# Patient Record
Sex: Female | Born: 1955 | Race: White | Hispanic: No | Smoking: Never smoker
Health system: Southern US, Community
[De-identification: ages and names within clinical notes are randomized; demographics above are authoritative.]

---

## 2007-09-27 ENCOUNTER — Ambulatory Visit (HOSPITAL_COMMUNITY): Payer: Self-pay | Admitting: Psychiatry

## 2007-11-29 ENCOUNTER — Ambulatory Visit (HOSPITAL_COMMUNITY): Payer: Self-pay | Admitting: Psychiatry

## 2007-12-30 ENCOUNTER — Ambulatory Visit (HOSPITAL_COMMUNITY): Payer: Self-pay | Admitting: Psychiatry

## 2008-01-24 ENCOUNTER — Ambulatory Visit (HOSPITAL_COMMUNITY): Payer: Self-pay | Admitting: Psychiatry

## 2008-05-23 ENCOUNTER — Ambulatory Visit (HOSPITAL_COMMUNITY): Payer: Self-pay | Admitting: Psychiatry

## 2008-07-12 ENCOUNTER — Ambulatory Visit (HOSPITAL_COMMUNITY): Payer: Self-pay | Admitting: Psychiatry

## 2008-10-09 ENCOUNTER — Ambulatory Visit (HOSPITAL_COMMUNITY): Payer: Self-pay | Admitting: Psychiatry

## 2009-01-11 ENCOUNTER — Ambulatory Visit (HOSPITAL_COMMUNITY): Payer: Self-pay | Admitting: Psychiatry

## 2009-04-09 ENCOUNTER — Ambulatory Visit (HOSPITAL_COMMUNITY): Payer: Self-pay | Admitting: Psychiatry

## 2009-07-09 ENCOUNTER — Ambulatory Visit (HOSPITAL_COMMUNITY): Payer: Self-pay | Admitting: Psychiatry

## 2009-09-14 ENCOUNTER — Ambulatory Visit (HOSPITAL_COMMUNITY): Admission: RE | Admit: 2009-09-14 | Discharge: 2009-09-14 | Payer: Self-pay | Admitting: Orthopaedic Surgery

## 2009-10-04 ENCOUNTER — Ambulatory Visit (HOSPITAL_COMMUNITY): Payer: Self-pay | Admitting: Psychiatry

## 2010-01-17 ENCOUNTER — Ambulatory Visit (HOSPITAL_COMMUNITY): Payer: Self-pay | Admitting: Psychiatry

## 2010-09-16 LAB — BASIC METABOLIC PANEL
CO2: 30 mEq/L (ref 19–32)
Chloride: 101 mEq/L (ref 96–112)
Potassium: 4.5 mEq/L (ref 3.5–5.1)

## 2010-09-16 LAB — CBC
HCT: 41.4 % (ref 36.0–46.0)
Hemoglobin: 14 g/dL (ref 12.0–15.0)
MCHC: 33.8 g/dL (ref 30.0–36.0)
MCV: 91.6 fL (ref 78.0–100.0)
Platelets: 277 10*3/uL (ref 150–400)
RDW: 14 % (ref 11.5–15.5)

## 2010-09-16 LAB — GLUCOSE, CAPILLARY
Glucose-Capillary: 237 mg/dL — ABNORMAL HIGH (ref 70–99)
Glucose-Capillary: 272 mg/dL — ABNORMAL HIGH (ref 70–99)

## 2011-03-26 DIAGNOSIS — M791 Myalgia, unspecified site: Secondary | ICD-10-CM | POA: Insufficient documentation

## 2011-05-06 IMAGING — CR DG CHEST 2V
2 series · 2 of 2 positions shown · non-contrast
Comparison: None.

CLINICAL DATA: Preop for left knee arthroscopy

CHEST - 2 VIEW

[view not recorded (1 of 2)]
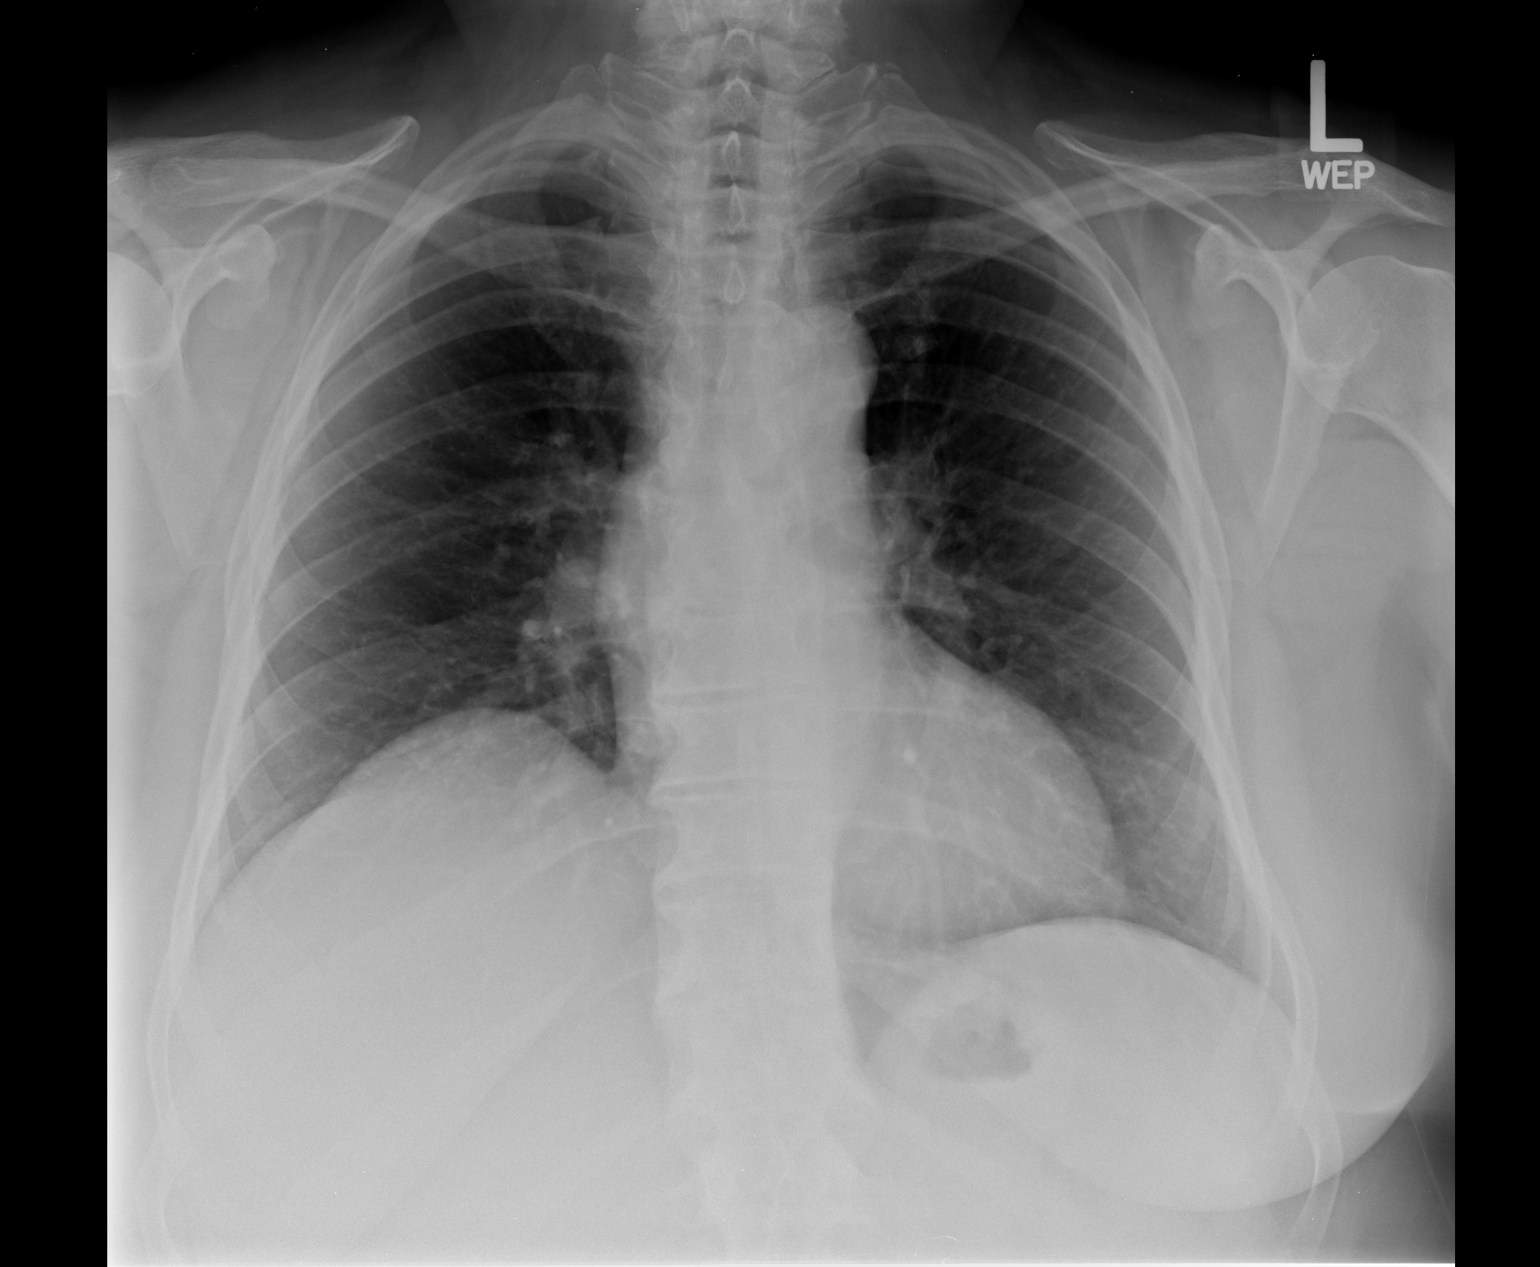

[view not recorded (2 of 2)]
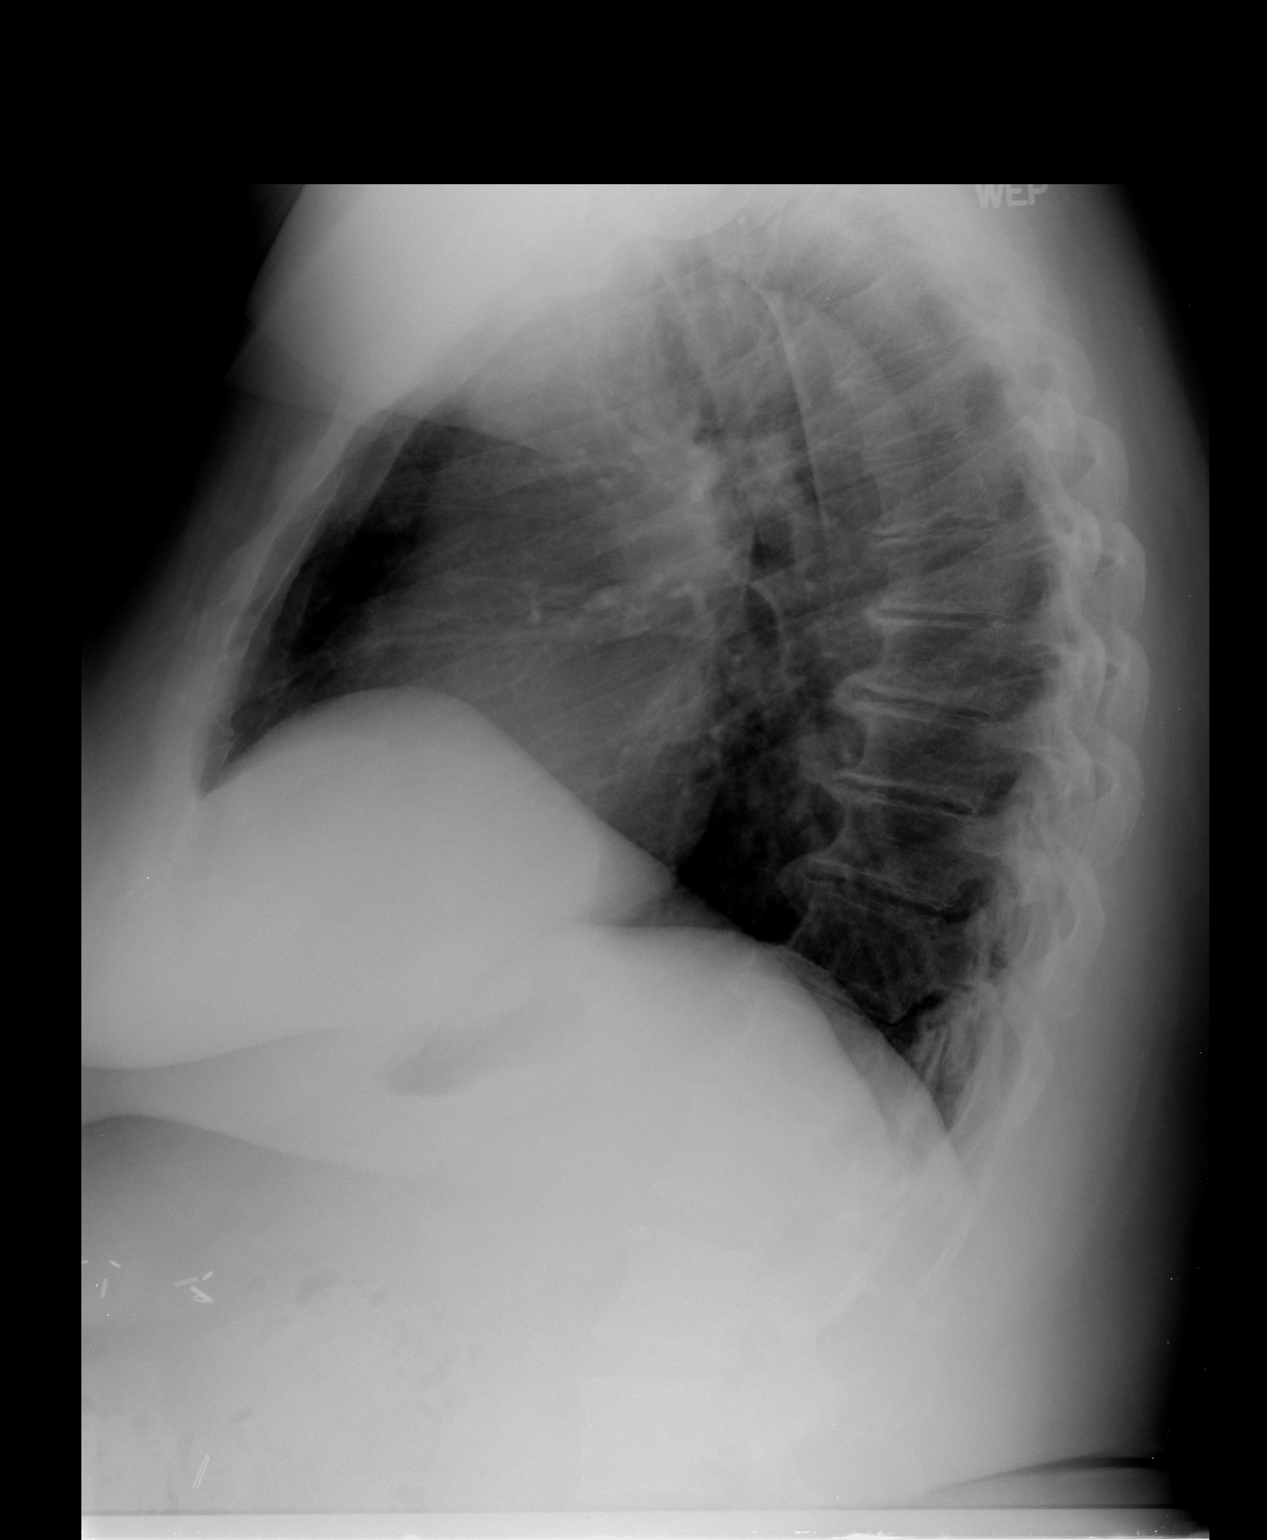

[2 of 2 positions shown; findings below may reference images not displayed]

FINDINGS: Cardiomediastinal silhouette is unremarkable.  No acute
infiltrate or pleural effusion.  No pulmonary edema.  Degenerative
changes thoracic spine. There is elevation of the right
hemidiaphragm.
IMPRESSION: No active disease.  Degenerative changes thoracic spine.

## 2011-07-02 DIAGNOSIS — E559 Vitamin D deficiency, unspecified: Secondary | ICD-10-CM | POA: Insufficient documentation

## 2011-08-05 DIAGNOSIS — F331 Major depressive disorder, recurrent, moderate: Secondary | ICD-10-CM | POA: Insufficient documentation

## 2013-02-10 DIAGNOSIS — E039 Hypothyroidism, unspecified: Secondary | ICD-10-CM | POA: Insufficient documentation

## 2013-04-13 DIAGNOSIS — E1159 Type 2 diabetes mellitus with other circulatory complications: Secondary | ICD-10-CM | POA: Insufficient documentation

## 2013-05-02 DIAGNOSIS — G4700 Insomnia, unspecified: Secondary | ICD-10-CM | POA: Insufficient documentation

## 2013-05-03 DIAGNOSIS — Z8673 Personal history of transient ischemic attack (TIA), and cerebral infarction without residual deficits: Secondary | ICD-10-CM | POA: Insufficient documentation

## 2014-12-05 DIAGNOSIS — G629 Polyneuropathy, unspecified: Secondary | ICD-10-CM | POA: Insufficient documentation

## 2016-04-18 DIAGNOSIS — M545 Low back pain: Secondary | ICD-10-CM

## 2016-04-18 DIAGNOSIS — G8929 Other chronic pain: Secondary | ICD-10-CM | POA: Insufficient documentation

## 2016-08-06 DIAGNOSIS — K760 Fatty (change of) liver, not elsewhere classified: Secondary | ICD-10-CM | POA: Insufficient documentation

## 2016-08-06 DIAGNOSIS — IMO0001 Reserved for inherently not codable concepts without codable children: Secondary | ICD-10-CM | POA: Insufficient documentation

## 2017-10-05 DIAGNOSIS — M2170 Unequal limb length (acquired), unspecified site: Secondary | ICD-10-CM | POA: Insufficient documentation

## 2017-10-05 DIAGNOSIS — M48061 Spinal stenosis, lumbar region without neurogenic claudication: Secondary | ICD-10-CM | POA: Insufficient documentation

## 2018-03-04 ENCOUNTER — Ambulatory Visit (HOSPITAL_COMMUNITY): Payer: Self-pay | Admitting: Psychiatry

## 2018-04-06 ENCOUNTER — Ambulatory Visit (HOSPITAL_COMMUNITY): Payer: Self-pay | Admitting: Psychiatry

## 2018-05-06 ENCOUNTER — Ambulatory Visit (HOSPITAL_COMMUNITY): Payer: Self-pay | Admitting: Psychiatry

## 2018-05-10 DIAGNOSIS — M4310 Spondylolisthesis, site unspecified: Secondary | ICD-10-CM | POA: Insufficient documentation

## 2018-05-11 ENCOUNTER — Encounter (HOSPITAL_COMMUNITY): Payer: Self-pay | Admitting: Psychiatry

## 2018-05-11 ENCOUNTER — Ambulatory Visit (INDEPENDENT_AMBULATORY_CARE_PROVIDER_SITE_OTHER): Payer: Medicare Other | Admitting: Psychiatry

## 2018-05-11 VITALS — BP 130/88 | HR 86 | Ht 66.0 in | Wt 240.0 lb

## 2018-05-11 DIAGNOSIS — M791 Myalgia, unspecified site: Secondary | ICD-10-CM | POA: Diagnosis not present

## 2018-05-11 DIAGNOSIS — F5102 Adjustment insomnia: Secondary | ICD-10-CM

## 2018-05-11 DIAGNOSIS — M545 Low back pain, unspecified: Secondary | ICD-10-CM

## 2018-05-11 DIAGNOSIS — F331 Major depressive disorder, recurrent, moderate: Secondary | ICD-10-CM | POA: Diagnosis not present

## 2018-05-11 DIAGNOSIS — F411 Generalized anxiety disorder: Secondary | ICD-10-CM | POA: Diagnosis not present

## 2018-05-11 DIAGNOSIS — G894 Chronic pain syndrome: Secondary | ICD-10-CM | POA: Insufficient documentation

## 2018-05-11 DIAGNOSIS — M47816 Spondylosis without myelopathy or radiculopathy, lumbar region: Secondary | ICD-10-CM | POA: Insufficient documentation

## 2018-05-11 DIAGNOSIS — G8929 Other chronic pain: Secondary | ICD-10-CM

## 2018-05-11 DIAGNOSIS — M5136 Other intervertebral disc degeneration, lumbar region: Secondary | ICD-10-CM | POA: Insufficient documentation

## 2018-05-11 MED ORDER — BUSPIRONE HCL 7.5 MG PO TABS: 7.5000 mg | ORAL_TABLET | Freq: Every day | ORAL | 1 refills | Status: DC

## 2018-05-11 MED ORDER — DULOXETINE HCL 30 MG PO CPEP: 30.0000 mg | ORAL_CAPSULE | Freq: Two times a day (BID) | ORAL | 1 refills | Status: DC

## 2018-05-11 MED ORDER — LAMOTRIGINE 100 MG PO TABS: ORAL_TABLET | ORAL | 1 refills | Status: DC
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
## 2018-05-31 ENCOUNTER — Ambulatory Visit (INDEPENDENT_AMBULATORY_CARE_PROVIDER_SITE_OTHER): Payer: Medicare Other | Admitting: Licensed Clinical Social Worker

## 2018-05-31 DIAGNOSIS — M791 Myalgia, unspecified site: Secondary | ICD-10-CM

## 2018-05-31 DIAGNOSIS — F411 Generalized anxiety disorder: Secondary | ICD-10-CM

## 2018-05-31 DIAGNOSIS — F5102 Adjustment insomnia: Secondary | ICD-10-CM

## 2018-05-31 DIAGNOSIS — F331 Major depressive disorder, recurrent, moderate: Secondary | ICD-10-CM

## 2018-05-31 DIAGNOSIS — M545 Low back pain: Secondary | ICD-10-CM

## 2018-05-31 DIAGNOSIS — G8929 Other chronic pain: Secondary | ICD-10-CM
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
## 2018-06-03 ENCOUNTER — Other Ambulatory Visit (HOSPITAL_COMMUNITY): Payer: Self-pay | Admitting: Psychiatry

## 2018-06-08 ENCOUNTER — Ambulatory Visit (INDEPENDENT_AMBULATORY_CARE_PROVIDER_SITE_OTHER): Payer: Medicare Other | Admitting: Psychiatry

## 2018-06-08 ENCOUNTER — Encounter (HOSPITAL_COMMUNITY): Payer: Self-pay | Admitting: Psychiatry

## 2018-06-08 ENCOUNTER — Other Ambulatory Visit: Payer: Self-pay

## 2018-06-08 VITALS — BP 130/88 | HR 92 | Ht 66.0 in | Wt 242.0 lb

## 2018-06-08 DIAGNOSIS — M791 Myalgia, unspecified site: Secondary | ICD-10-CM | POA: Diagnosis not present

## 2018-06-08 DIAGNOSIS — F5102 Adjustment insomnia: Secondary | ICD-10-CM | POA: Diagnosis not present

## 2018-06-08 DIAGNOSIS — F331 Major depressive disorder, recurrent, moderate: Secondary | ICD-10-CM | POA: Diagnosis not present

## 2018-06-08 DIAGNOSIS — F411 Generalized anxiety disorder: Secondary | ICD-10-CM | POA: Diagnosis not present

## 2018-06-08 MED ORDER — LAMOTRIGINE 100 MG PO TABS: ORAL_TABLET | ORAL | 1 refills | Status: DC

## 2018-06-08 MED ORDER — DULOXETINE HCL 30 MG PO CPEP: 30.0000 mg | ORAL_CAPSULE | Freq: Two times a day (BID) | ORAL | 1 refills | Status: DC

## 2018-06-08 MED ORDER — BUSPIRONE HCL 7.5 MG PO TABS: 7.5000 mg | ORAL_TABLET | Freq: Two times a day (BID) | ORAL | 1 refills | Status: DC
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
## 2018-06-22 ENCOUNTER — Ambulatory Visit (HOSPITAL_COMMUNITY): Payer: Medicare Other | Admitting: Licensed Clinical Social Worker

## 2018-07-02 ENCOUNTER — Other Ambulatory Visit (HOSPITAL_COMMUNITY): Payer: Self-pay | Admitting: Psychiatry

## 2018-07-05 ENCOUNTER — Ambulatory Visit (HOSPITAL_COMMUNITY): Payer: Medicare Other | Admitting: Licensed Clinical Social Worker

## 2018-07-13 ENCOUNTER — Ambulatory Visit (INDEPENDENT_AMBULATORY_CARE_PROVIDER_SITE_OTHER): Payer: Medicare Other | Admitting: Licensed Clinical Social Worker

## 2018-07-13 DIAGNOSIS — M545 Low back pain: Secondary | ICD-10-CM

## 2018-07-13 DIAGNOSIS — F411 Generalized anxiety disorder: Secondary | ICD-10-CM

## 2018-07-13 DIAGNOSIS — F5102 Adjustment insomnia: Secondary | ICD-10-CM

## 2018-07-13 DIAGNOSIS — M791 Myalgia, unspecified site: Secondary | ICD-10-CM | POA: Diagnosis not present

## 2018-07-13 DIAGNOSIS — G8929 Other chronic pain: Secondary | ICD-10-CM

## 2018-07-13 DIAGNOSIS — F331 Major depressive disorder, recurrent, moderate: Secondary | ICD-10-CM | POA: Diagnosis not present
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
## 2018-08-10 ENCOUNTER — Ambulatory Visit (HOSPITAL_COMMUNITY): Payer: Medicare Other | Admitting: Licensed Clinical Social Worker

## 2018-08-17 ENCOUNTER — Ambulatory Visit (HOSPITAL_COMMUNITY): Payer: Medicare Other | Admitting: Licensed Clinical Social Worker

## 2018-08-31 ENCOUNTER — Other Ambulatory Visit (HOSPITAL_COMMUNITY): Payer: Self-pay | Admitting: Psychiatry

## 2018-09-02 ENCOUNTER — Ambulatory Visit (HOSPITAL_COMMUNITY): Payer: Medicare Other | Admitting: Psychiatry

## 2018-09-08 ENCOUNTER — Ambulatory Visit (HOSPITAL_COMMUNITY): Payer: Medicare Other | Admitting: Licensed Clinical Social Worker

## 2018-09-14 ENCOUNTER — Ambulatory Visit (HOSPITAL_COMMUNITY): Payer: Medicare Other | Admitting: Licensed Clinical Social Worker

## 2018-09-20 ENCOUNTER — Ambulatory Visit (INDEPENDENT_AMBULATORY_CARE_PROVIDER_SITE_OTHER): Payer: Medicare Other | Admitting: Psychiatry

## 2018-09-20 ENCOUNTER — Other Ambulatory Visit: Payer: Self-pay

## 2018-09-20 ENCOUNTER — Encounter (HOSPITAL_COMMUNITY): Payer: Self-pay | Admitting: Psychiatry

## 2018-09-20 DIAGNOSIS — F5102 Adjustment insomnia: Secondary | ICD-10-CM

## 2018-09-20 DIAGNOSIS — F331 Major depressive disorder, recurrent, moderate: Secondary | ICD-10-CM

## 2018-09-20 DIAGNOSIS — F411 Generalized anxiety disorder: Secondary | ICD-10-CM | POA: Diagnosis not present

## 2018-09-20 MED ORDER — DULOXETINE HCL 30 MG PO CPEP: 30.0000 mg | ORAL_CAPSULE | Freq: Two times a day (BID) | ORAL | 3 refills | Status: DC

## 2018-09-20 MED ORDER — LAMOTRIGINE 100 MG PO TABS: ORAL_TABLET | ORAL | 2 refills | Status: DC
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
## 2018-09-30 ENCOUNTER — Ambulatory Visit (INDEPENDENT_AMBULATORY_CARE_PROVIDER_SITE_OTHER): Payer: Medicare Other | Admitting: Licensed Clinical Social Worker

## 2018-09-30 DIAGNOSIS — F5102 Adjustment insomnia: Secondary | ICD-10-CM

## 2018-09-30 DIAGNOSIS — F411 Generalized anxiety disorder: Secondary | ICD-10-CM

## 2018-09-30 DIAGNOSIS — F331 Major depressive disorder, recurrent, moderate: Secondary | ICD-10-CM | POA: Diagnosis not present
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
## 2018-10-13 ENCOUNTER — Ambulatory Visit (INDEPENDENT_AMBULATORY_CARE_PROVIDER_SITE_OTHER): Payer: Medicare Other | Admitting: Licensed Clinical Social Worker

## 2018-10-13 DIAGNOSIS — F5102 Adjustment insomnia: Secondary | ICD-10-CM

## 2018-10-13 DIAGNOSIS — F411 Generalized anxiety disorder: Secondary | ICD-10-CM | POA: Diagnosis not present

## 2018-10-13 DIAGNOSIS — F331 Major depressive disorder, recurrent, moderate: Secondary | ICD-10-CM | POA: Diagnosis not present

## 2018-10-13 NOTE — Progress Notes (Signed)
Virtual Visit via Telephone Note  I connected with [name] on 10/13/18 at  1:00 PM EDT by telephone and verified that I am speaking with the correct person using two identifiers.   I discussed the limitations, risks, security and privacy concerns of performing an evaluation and management service by telephone and the availability of in person appointments. I also discussed with the patient that there may be a patient responsible charge related to this service. The patient expressed understanding and agreed to proceed.  Follow Up Instructions:    I discussed the assessment and treatment plan with the patient. The patient was provided an opportunity to ask questions and all were answered. The patient agreed with the plan and demonstrated an understanding of the instructions.   The patient was advised to call back or seek an in-person evaluation if the symptoms worsen or if the condition fails to improve as anticipated.  I provided 52 minutes of non-face-to-face time during this encounter.   [name], LCSW   THERAPIST PROGRESS NOTE  Session Time: 1:00 PM to 1:52 PM  Participation Level: Active  Behavioral Response: CasualAlertappropriate  Type of Therapy: Individual Therapy  Treatment Goals addressed:   decrease in depression and anxiety (patient recognizes the need to be more active in improving symptoms  Interventions: Solution Focused, Strength-based, Supportive and Other: coping  Summary: Isabel Ellis is a 63 y.o. female who presents with really sick with sinus infection for past two weeks, headache for three days-cried constantly, tried everything and called doctor and said that she can't take it anymore. (Dr. 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 Finally went to sleep after taking migraine medicine, woke up and headache was tolerable and then finally went away.  Had dinner with  daughter(Isabel Ellis) and and son-in-law first time talked to each other in over a year. Was so good to talk to her, heart was filled up with joy. Now closer with her that "things may turn up". Had been estranged.   Worried about current situation and where she is going to live. Mortgage company working with her. [facility] sent a court order for back taxes which she thought ex-husband had been paying.  Filled out form to submit along with divorce and separation agreement that shows ex-husband responsible to pay mortgage and [name], taxes. She doesn't have his address. Plans to call circuit court to see if they can help locate him. Kids will help with helping her find a place providing finances. Would like to find a way to keep the house if she can. Expenses include has to do repairs on house.  Used to ladder where she is on steps that goes up (can last a couple of years) and then goes down and doesn't know how to break cycle. Things were good with husband, married for 20 years and then he left her. Afraid to go up ladder because tired of the cycle, this is the way she was in  2019. She would like to keep climbing the ladder, but always get knocked down.  Shared positive news that wedding going forward and that is something that makes her happy.  Sister in [place], executor of dad's estate, they have five properties, she may ask sister for back taxes if she has to and take out of property when it sell.   Suicidal/Homicidal: No  Therapist Response: Therapist assessed patient current functioning and processed feelings related to current  stressors.  Engaged in problem-solving to help patient in addressing stressors.  Suggested for example, calling senior center who may have available resources to direct patient to Pointed out positive developments helping with mood including talking to daughter after being more distant, son's wedding going forward.  Helpful for mood to have things to look forward to.  Discussed kids  helping is giving her some security, and in general having supports helps with coping, helps with managing stressors. Reviewed treatment plan and patient gave verbal assent to completing plan.  While doing treatment plan getting more aware of supports and pointed this out to patient as helpful to have someone to turn to if she needs to, people she is known for long time.   Provided positive feedback for patient being proactive in steps to work on stressors and explained this will help her move forward with goals.   Reviewed patient's description of feeling dealing with setbacks, and discussed per minute positive qualities and when life that one becomes aware of that is there no matter what happened such as her family, positive activities of her life that she enjoys and importantly having herself.  That these permanent things can provide contentment. The same time role of therapy is help her move forward with goals, currently assist patient in problem-solving related to stressors that help her move forward with her goals.  Provided strength based and supportive intervention.  Plan: Return again in 2 weeks.2.  Therapist continue to work with patient on stress management, supportive interventions and mood regulation  Diagnosis: Axis I:  major depressive disorder, recurrent, moderate, generalized anxiety disorder, adjustment insomnia    Axis II: No diagnosis    Coolidge BreezeMary Bowman, LCSW 10/13/2018

## 2018-10-25 ENCOUNTER — Ambulatory Visit (INDEPENDENT_AMBULATORY_CARE_PROVIDER_SITE_OTHER): Payer: Medicare Other | Admitting: Licensed Clinical Social Worker

## 2018-10-25 ENCOUNTER — Other Ambulatory Visit: Payer: Self-pay

## 2018-10-25 DIAGNOSIS — F5102 Adjustment insomnia: Secondary | ICD-10-CM

## 2018-10-25 DIAGNOSIS — F411 Generalized anxiety disorder: Secondary | ICD-10-CM | POA: Diagnosis not present

## 2018-10-25 DIAGNOSIS — F331 Major depressive disorder, recurrent, moderate: Secondary | ICD-10-CM
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
## 2018-10-26 ENCOUNTER — Other Ambulatory Visit (HOSPITAL_COMMUNITY): Payer: Self-pay | Admitting: Psychiatry

## 2018-11-08 ENCOUNTER — Ambulatory Visit (INDEPENDENT_AMBULATORY_CARE_PROVIDER_SITE_OTHER): Payer: Medicare Other | Admitting: Licensed Clinical Social Worker

## 2018-11-08 DIAGNOSIS — F5102 Adjustment insomnia: Secondary | ICD-10-CM

## 2018-11-08 DIAGNOSIS — F331 Major depressive disorder, recurrent, moderate: Secondary | ICD-10-CM

## 2018-11-08 NOTE — Progress Notes (Signed)
  Virtual Visit via Telephone Note  I connected with Isabel Ellis on 11/08/18 at  9:00 AM EDT by telephone and verified that I am speaking with the correct person using two identifiers.   I discussed the limitations, risks, security and privacy concerns of performing an evaluation and management service by telephone and the availability of in person appointments. I also discussed with the patient that there may be a patient responsible charge related to this service. The patient expressed understanding and agreed to proceed.  THERAPIST PROGRESS NOTE  Session Time: 9:01 to 9:16 AM  Participation Level: Active  Behavioral Response: CasualAlertnot feeling well due to sinus problems  Type of Therapy: Individual Therapy  Treatment Goals addressed: decrease in depression and anxiety (patient recognizes the need to be more active in improving symptoms  Interventions: Supportive and Other: coping, strategies to help improve mood  Summary: Isabel Ellis is a 63 y.o. female who presents with not feeling well today, having problems with sinus, bad headache, ears hurt and doesn't feel like talking. May have to go in to doctor at this point. She has been on Augmentin, Prednisone for 7 days which she has to be careful on, Phenrgan, strong antibiotic that worked at first but then got worse tore up stomach, been on two antibiotics. Shared that son's wedding was beautiful. A moment that was special for her was being able to dance with her son. Ryder, her grandchild, who stays with her a couple days a week, came up to her, very attentive and told her to put her head on shoulder.  She was happy that with so few people it still turned out really well.  Suicidal/Homicidal: No  Therapist Response: Assess patient current functioning per report and patient not feeling well today, so session kept short.  Reviewed her physical symptoms and provide supportive interventions.  Reviewed events from her son's wedding  and very positive moments for her that will be memorable for her.  Pointed out that these special and memorable events are aspects of her life that will help with mood, also to consider that they will continue, helping to provide positive experiences, how connection with family provides purpose and meaning and fulfilling.  Plan: Return again in 3 weeks.2.  Continue to work with patient on treatment goals of decreasing depression, strategies to decrease anxiety  Diagnosis: Axis I: major depressive disorder, recurrent, moderate, generalized anxiety disorder, adjustment insomnia    Axis II: No diagnosis   Follow Up Instructions:    I discussed the assessment and treatment plan with the patient. The patient was provided an opportunity to ask questions and all were answered. The patient agreed with the plan and demonstrated an understanding of the instructions.   The patient was advised to call back or seek an in-person evaluation if the symptoms worsen or if the condition fails to improve as anticipated.  I provided 15 minutes of non-face-to-face time during this encounter.   [name], LCSW 11/08/2018

## 2018-12-03 ENCOUNTER — Ambulatory Visit (HOSPITAL_COMMUNITY): Payer: Medicare Other | Admitting: Licensed Clinical Social Worker

## 2018-12-14 ENCOUNTER — Other Ambulatory Visit (HOSPITAL_COMMUNITY): Payer: Self-pay | Admitting: Psychiatry

## 2018-12-15 ENCOUNTER — Ambulatory Visit (HOSPITAL_COMMUNITY): Payer: Medicare Other | Admitting: Licensed Clinical Social Worker

## 2018-12-26 ENCOUNTER — Other Ambulatory Visit (HOSPITAL_COMMUNITY): Payer: Self-pay | Admitting: Psychiatry

## 2019-01-17 ENCOUNTER — Other Ambulatory Visit (HOSPITAL_COMMUNITY): Payer: Self-pay | Admitting: Psychiatry

## 2019-03-21 ENCOUNTER — Ambulatory Visit (INDEPENDENT_AMBULATORY_CARE_PROVIDER_SITE_OTHER): Payer: Medicare Other | Admitting: Psychiatry

## 2019-03-21 ENCOUNTER — Encounter (HOSPITAL_COMMUNITY): Payer: Self-pay | Admitting: Psychiatry

## 2019-03-21 DIAGNOSIS — F411 Generalized anxiety disorder: Secondary | ICD-10-CM | POA: Diagnosis not present

## 2019-03-21 DIAGNOSIS — F331 Major depressive disorder, recurrent, moderate: Secondary | ICD-10-CM | POA: Diagnosis not present

## 2019-03-21 DIAGNOSIS — F5102 Adjustment insomnia: Secondary | ICD-10-CM

## 2019-03-21 MED ORDER — DULOXETINE HCL 30 MG PO CPEP: 30.0000 mg | ORAL_CAPSULE | Freq: Two times a day (BID) | ORAL | 0 refills | Status: DC
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
## 2019-03-30 ENCOUNTER — Telehealth (HOSPITAL_COMMUNITY): Payer: Self-pay

## 2019-03-30 NOTE — Telephone Encounter (Signed)
Received fax from [place] requesting a refill on patient's Lamotrigine 100mg . She has an appointment scheduled for 08/15/2019. What quantity would you like sent in for this? Please review and advise. Thank you.

## 2019-03-31 ENCOUNTER — Other Ambulatory Visit (HOSPITAL_COMMUNITY): Payer: Self-pay

## 2019-03-31 MED ORDER — LAMOTRIGINE 100 MG PO TABS: ORAL_TABLET | ORAL | 0 refills | Status: DC

## 2019-03-31 NOTE — Telephone Encounter (Signed)
If was sent 90 day before. You can send for 90 days

## 2019-03-31 NOTE — Telephone Encounter (Signed)
Done

## 2019-06-29 ENCOUNTER — Other Ambulatory Visit (HOSPITAL_COMMUNITY): Payer: Self-pay | Admitting: Psychiatry

## 2019-07-01 ENCOUNTER — Other Ambulatory Visit (HOSPITAL_COMMUNITY): Payer: Self-pay

## 2019-07-01 MED ORDER — BUSPIRONE HCL 7.5 MG PO TABS: 7.5000 mg | ORAL_TABLET | Freq: Two times a day (BID) | ORAL | 0 refills | Status: AC

## 2019-07-16 ENCOUNTER — Other Ambulatory Visit (HOSPITAL_COMMUNITY): Payer: Self-pay | Admitting: Psychiatry

## 2019-08-15 ENCOUNTER — Ambulatory Visit (HOSPITAL_COMMUNITY): Payer: Medicare Other | Admitting: Psychiatry

## 2019-08-15 ENCOUNTER — Other Ambulatory Visit: Payer: Self-pay

## 2019-09-26 ENCOUNTER — Other Ambulatory Visit (HOSPITAL_COMMUNITY): Payer: Self-pay | Admitting: Psychiatry

## 2019-10-31 ENCOUNTER — Other Ambulatory Visit (HOSPITAL_COMMUNITY): Payer: Self-pay | Admitting: Psychiatry
